# Patient Record
Sex: Female | Born: 1950 | Race: White | Hispanic: No
Health system: Southern US, Community
[De-identification: ages and names within clinical notes are randomized; demographics above are authoritative.]

---

## 2004-07-15 ENCOUNTER — Inpatient Hospital Stay (HOSPITAL_COMMUNITY): Admission: AC | Admit: 2004-07-15 | Discharge: 2004-07-20 | Payer: Self-pay

## 2004-07-20 ENCOUNTER — Inpatient Hospital Stay (HOSPITAL_COMMUNITY)
Admission: RE | Admit: 2004-07-20 | Discharge: 2004-07-31 | Payer: Self-pay | Admitting: Physical Medicine & Rehabilitation

## 2004-07-20 ENCOUNTER — Ambulatory Visit: Payer: Self-pay | Admitting: Physical Medicine & Rehabilitation

## 2004-09-05 ENCOUNTER — Encounter: Admission: RE | Admit: 2004-09-05 | Discharge: 2004-09-05 | Payer: Self-pay | Admitting: Neurosurgery

## 2004-10-05 ENCOUNTER — Encounter: Admission: RE | Admit: 2004-10-05 | Discharge: 2004-10-05 | Payer: Self-pay | Admitting: Neurosurgery

## 2004-10-10 ENCOUNTER — Encounter: Admission: RE | Admit: 2004-10-10 | Discharge: 2004-10-10 | Payer: Self-pay | Admitting: Neurosurgery

## 2004-12-07 ENCOUNTER — Encounter: Admission: RE | Admit: 2004-12-07 | Discharge: 2004-12-07 | Payer: Self-pay | Admitting: Orthopedic Surgery

## 2004-12-27 ENCOUNTER — Encounter: Admission: RE | Admit: 2004-12-27 | Discharge: 2005-03-27 | Payer: Self-pay | Admitting: Orthopedic Surgery

## 2005-03-28 ENCOUNTER — Encounter: Admission: RE | Admit: 2005-03-28 | Discharge: 2005-03-28 | Payer: Self-pay | Admitting: Neurosurgery
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
## 2006-06-12 ENCOUNTER — Ambulatory Visit (HOSPITAL_COMMUNITY): Admission: RE | Admit: 2006-06-12 | Discharge: 2006-06-12 | Payer: Self-pay | Admitting: Family Medicine

## 2006-06-12 ENCOUNTER — Ambulatory Visit: Payer: Self-pay | Admitting: Family Medicine

## 2006-12-24 IMAGING — MG MM DIGITAL SCREENING BILAT
5 series · 5 of 5 positions shown · non-contrast
Comparison: none

DG SCREEN MAMMOGRAM BILATERAL
Bilateral CC and MLO view(s) were taken.

SCREENING MAMMOGRAM:
There is a fibroglandular pattern.  No masses or malignant type calcifications are identified.

[R CC]
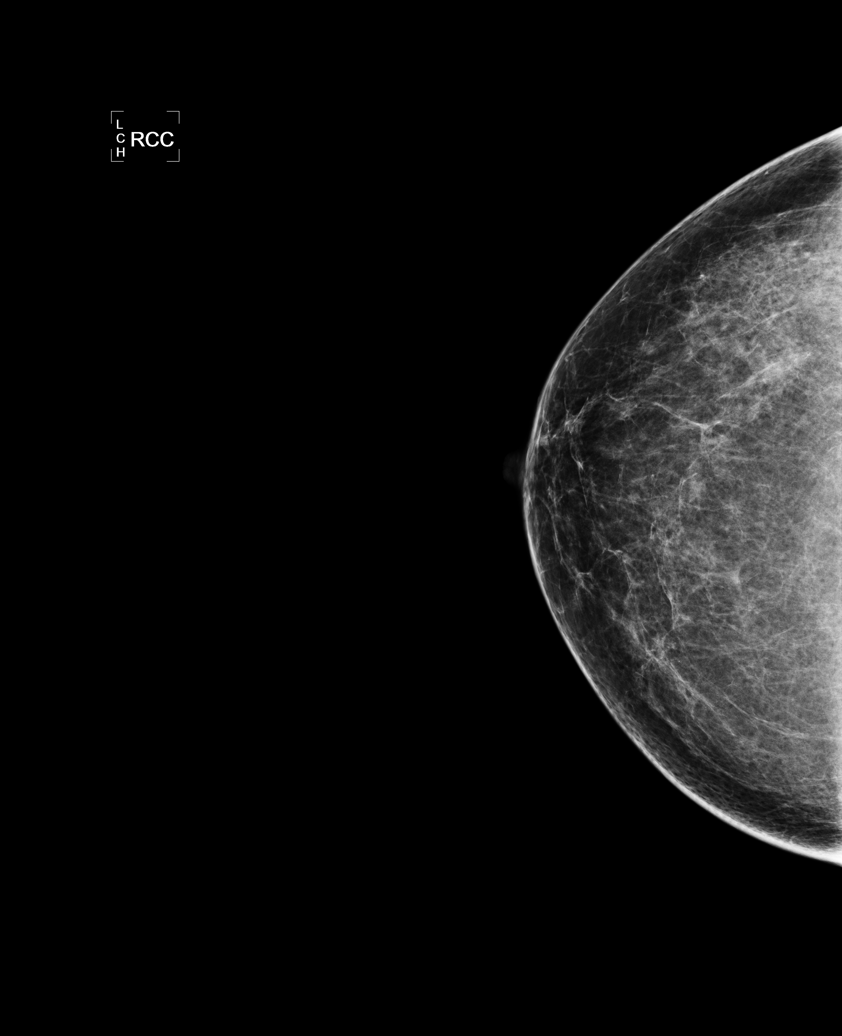

[L MLO (1 of 3)]
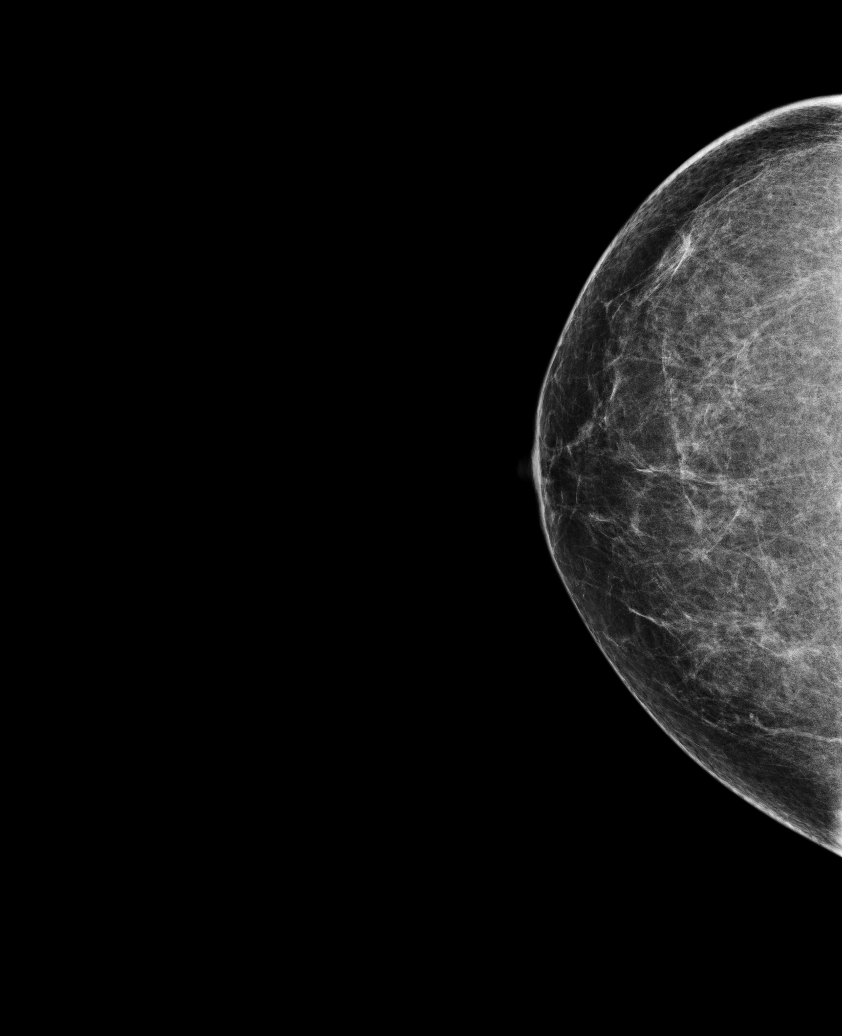

[L MLO (2 of 3)]
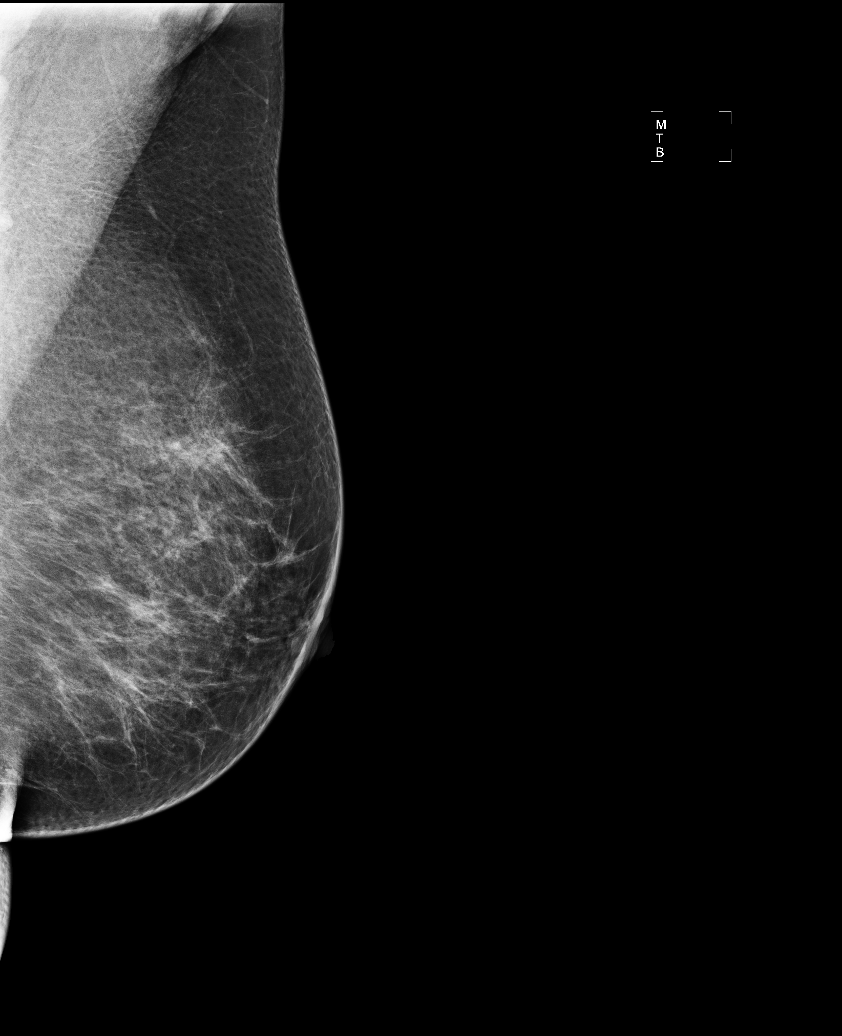

[L MLO (3 of 3)]
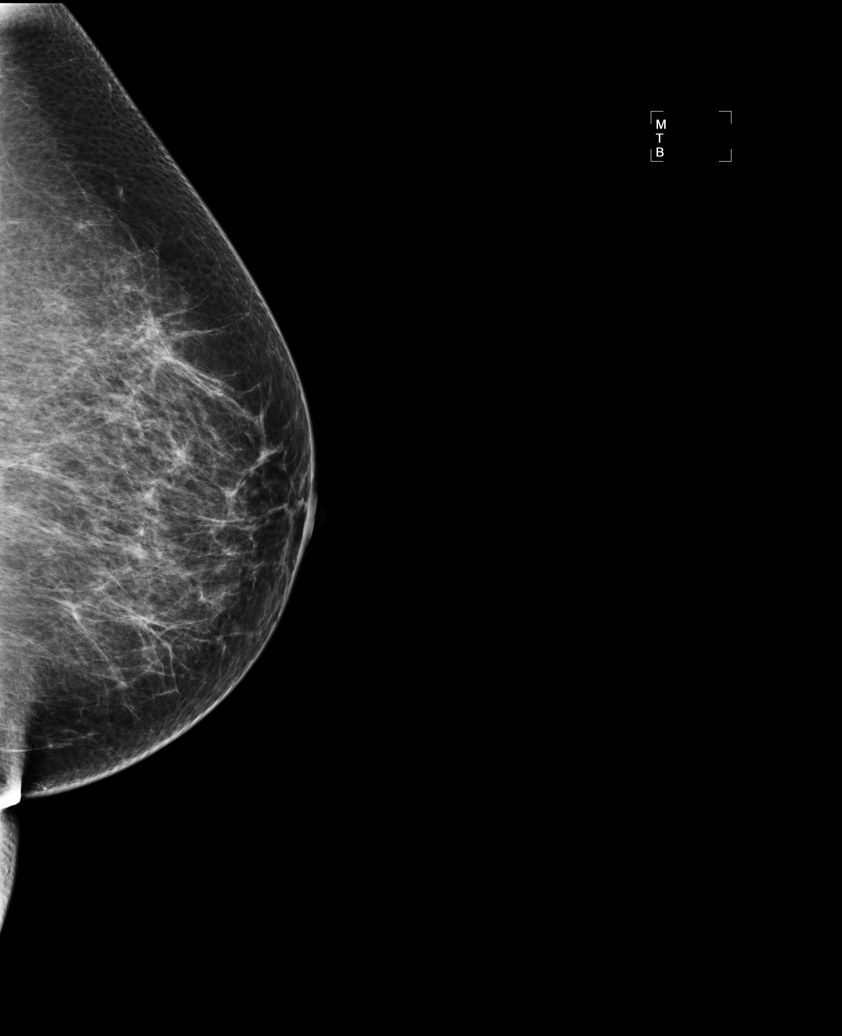

[R MLO]
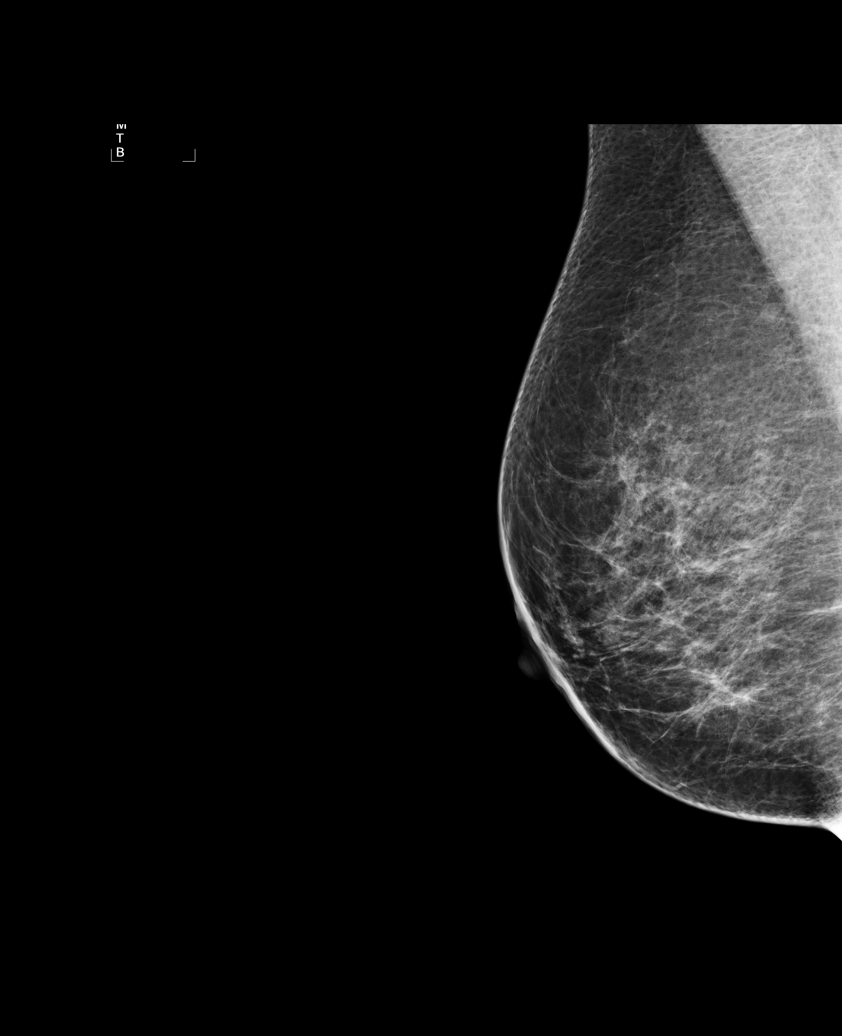

[5 of 5 positions shown; findings below may reference images not displayed]

IMPRESSION: No specific mammographic evidence of malignancy.  Next screening mammogram is recommended in one 
year.

ASSESSMENT: Negative - BI-RADS 1

Screening mammogram in 1 year.
ANALYZED BY COMPUTER AIDED DETECTION. , THIS PROCEDURE WAS A DIGITAL MAMMOGRAM.

## 2008-08-11 ENCOUNTER — Ambulatory Visit (HOSPITAL_COMMUNITY): Admission: RE | Admit: 2008-08-11 | Discharge: 2008-08-11 | Payer: Self-pay | Admitting: Family Medicine

## 2008-09-14 ENCOUNTER — Encounter
Admission: RE | Admit: 2008-09-14 | Discharge: 2008-12-13 | Payer: Self-pay | Admitting: Physical Medicine & Rehabilitation

## 2008-09-16 ENCOUNTER — Ambulatory Visit: Payer: Self-pay | Admitting: Physical Medicine & Rehabilitation

## 2008-10-06 ENCOUNTER — Ambulatory Visit: Payer: Self-pay | Admitting: Physical Medicine & Rehabilitation

## 2008-11-29 ENCOUNTER — Ambulatory Visit: Payer: Self-pay | Admitting: Physical Medicine & Rehabilitation

## 2008-12-28 ENCOUNTER — Encounter
Admission: RE | Admit: 2008-12-28 | Discharge: 2008-12-28 | Payer: Self-pay | Admitting: Physical Medicine & Rehabilitation

## 2008-12-29 ENCOUNTER — Ambulatory Visit: Payer: Self-pay | Admitting: Physical Medicine & Rehabilitation

## 2010-12-09 ENCOUNTER — Encounter: Payer: Self-pay | Admitting: Orthopedic Surgery

## 2010-12-10 ENCOUNTER — Encounter: Payer: Self-pay | Admitting: Family Medicine

## 2010-12-10 ENCOUNTER — Encounter: Payer: Self-pay | Admitting: Neurosurgery
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
## 2016-02-28 ENCOUNTER — Encounter: Payer: Self-pay | Admitting: Physical Medicine & Rehabilitation

## 2022-11-27 ENCOUNTER — Telehealth: Payer: Self-pay

## 2022-11-27 NOTE — Telephone Encounter (Signed)
Care everywhere 

## 2023-01-15 ENCOUNTER — Ambulatory Visit: Payer: Medicare HMO | Admitting: Psychiatry

## 2023-06-11 ENCOUNTER — Ambulatory Visit: Payer: Medicare HMO | Admitting: Psychiatry
